# Patient Record
Sex: Female | Born: 1965 | Hispanic: Yes | Smoking: Never smoker
Health system: Southern US, Community
[De-identification: ages and names within clinical notes are randomized; demographics above are authoritative.]

## PROBLEM LIST (undated history)

## (undated) DIAGNOSIS — T7840XA Allergy, unspecified, initial encounter: Secondary | ICD-10-CM

## (undated) HISTORY — PX: TUBAL LIGATION: SHX77

---

## 2020-01-30 HISTORY — PX: JOINT REPLACEMENT: SHX530

## 2020-06-17 ENCOUNTER — Other Ambulatory Visit: Payer: Self-pay | Admitting: Orthopedic Surgery

## 2020-06-17 ENCOUNTER — Encounter: Payer: Self-pay | Admitting: Orthopedic Surgery

## 2020-06-17 NOTE — H&P (Signed)
Linda Wilkinson MRN:  [id number] DOB/SEX:  [birth year]/female  CHIEF COMPLAINT:  Stiff left Knee  HISTORY: Patient is a 54 y.o. female presented with a history of decreased range of motion in the left knee. Onset of symptoms was gradual starting several months ago with gradually worsening course since that time. Prior procedures on the knee include arthroplasty. Patient has been treated conservatively with over-the-counter NSAIDs and activity modification. Patient currently rates pain in the knee at 6 out of 10 with activity. There is pain at night.  PAST MEDICAL HISTORY: There are no problems to display for this patient.  No past medical history on file.    MEDICATIONS:  (Not in a hospital admission)   ALLERGIES:   Allergies  Allergen Reactions  . Cat Hair Extract Other (See Comments)    Runny nose /Sneezing  . Shrimp Extract Allergy Skin Test Other (See Comments)    Sneezing/Can eat max of 10      REVIEW OF SYSTEMS:  Pertinent items noted in HPI and remainder of comprehensive ROS otherwise negative.   FAMILY HISTORY:  No family history on file.  SOCIAL HISTORY:   Social History   Tobacco Use  . Smoking status: Not on file  Substance Use Topics  . Alcohol use: Not on file     EXAMINATION:  Vital signs in last 24 hours: @VSRANGES @  General appearance: alert, cooperative and no distress Neck: no JVD and supple, symmetrical, trachea midline Lungs: clear to auscultation bilaterally Heart: regular rate and rhythm, S1, S2 normal, no murmur, click, rub or gallop Abdomen: soft, non-tender; bowel sounds normal; no masses,  no organomegaly Extremities: extremities normal, atraumatic, no cyanosis or edema and Homans sign is negative, no sign of DVT Pulses: 2+ and symmetric Skin: Skin color, texture, turgor normal. No rashes or lesions Neurologic: Alert and oriented X 3, normal strength and tone. Normal symmetric reflexes. Normal coordination and gait  Musculoskeletal:  ROM  0-80, Ligaments intact,   Assessment/Plan: Stiff left knee following TKA  The patient history, physical examination and imaging studies are consistent with stiffness after TKA of  the left knee. The patient has failed conservative treatment.  The clearance notes were reviewed.  After discussion with the patient it was felt that closed knee manipulation was indicated. The procedure,  risks, and benefits of total knee arthroplasty were presented and reviewed.The patient acknowledged the explanation, agreed to proceed with the plan.  06/17/2020, 10:34 AM

## 2020-06-18 ENCOUNTER — Other Ambulatory Visit: Payer: Self-pay

## 2020-06-18 ENCOUNTER — Other Ambulatory Visit
Admission: RE | Admit: 2020-06-18 | Discharge: 2020-06-18 | Disposition: A | Discharge status: Home / Self Care | Payer: BC Managed Care – PPO | Source: Ambulatory Visit | Attending: Orthopedic Surgery | Admitting: Orthopedic Surgery

## 2020-06-18 DIAGNOSIS — Z01812 Encounter for preprocedural laboratory examination: Secondary | ICD-10-CM | POA: Insufficient documentation

## 2020-06-18 DIAGNOSIS — Z20822 Contact with and (suspected) exposure to covid-19: Secondary | ICD-10-CM | POA: Diagnosis not present

## 2020-06-18 HISTORY — DX: Allergy, unspecified, initial encounter: T78.40XA

## 2020-06-18 NOTE — Patient Instructions (Signed)
Your procedure is scheduled on: Monday June 22, 2020. Report to Day Surgery inside [facility] 2nd floor. To find out your arrival time please call [phone] between 1PM - 3PM on Friday June 19, 2020.  Remember: Instructions that are not followed completely may result in serious medical risk,  up to and including death, or upon the discretion of your surgeon and anesthesiologist your  surgery may need to be rescheduled.     _X__ 1. Do not eat food after midnight the night before your procedure.                 No chewing gum or hard candies. You may drink clear liquids up to 2 hours                 before you are scheduled to arrive for your surgery- DO not drink clear                 liquids within 2 hours of the start of your surgery.                 Clear Liquids include:  water, apple juice without pulp, clear Gatorade, G2 or                  Gatorade Zero (avoid Red/Purple/Blue), Black Coffee or Tea (Do not add                 anything to coffee or tea).  __X__2.  On the morning of surgery brush your teeth with toothpaste and water, you                may rinse your mouth with mouthwash if you wish.  Do not swallow any toothpaste of mouthwash.     _X__ 3.  No Alcohol for 24 hours before or after surgery.   _X__ 4.  Do Not Smoke or use e-cigarettes For 24 Hours Prior to Your Surgery.                 Do not use any chewable tobacco products for at least 6 hours prior to                 Surgery.  _X__  5.  Do not use any recreational drugs (marijuana, cocaine, heroin, ecstasy, MDMA or other)                For at least one week prior to your surgery.  Combination of these drugs with anesthesia                May have life threatening results.  __X__6.  Notify your doctor if there is any change in your medical condition      (cold, fever, infections).     Do not wear jewelry, make-up, hairpins, clips or nail polish. Do not wear lotions, powders,  or perfumes. You may wear deodorant. Do not shave 48 hours prior to surgery. Men may shave face and neck. Do not bring valuables to the hospital.    [facility] is not responsible for any belongings or valuables.  Contacts, dentures or bridgework may not be worn into surgery. Leave your suitcase in the car. After surgery it may be brought to your room. For patients admitted to the hospital, discharge time is determined by your treatment team.   Patients discharged the day of surgery will not be allowed to drive home.   Make arrangements for someone to be with  you for the first 24 hours of your Same Day Discharge.   __X__ Take these medicines the morning of surgery with A SIP OF WATER:    1. acetaminophen (TYLENOL) 650 MG    ____ Fleet Enema (as directed)   __X__ Use CHG Soap (or wipes) as directed  ____ Use Benzoyl Peroxide Gel as instructed  ____ Use inhalers on the day of surgery  ____ Stop metformin 2 days prior to surgery    ____ Take 1/2 of usual insulin dose the night before surgery. No insulin the morning          of surgery.   ____ Stop Coumadin/Plavix/aspirin   __X__ Stop Anti-inflammatories such as diclofenac (VOLTAREN), Ibuprofen, Aleve, Advil, naproxen, aspirin and or BC powders.    ____ Stop supplements until after surgery.    __X__ Do not start any herbal supplements before your procedure.     If you have any questions regarding your pre-procedure instructions,  Please call Pre-admit Testing at [phone].

## 2020-06-19 ENCOUNTER — Other Ambulatory Visit: Payer: BC Managed Care – PPO

## 2020-06-19 LAB — SARS CORONAVIRUS 2 (TAT 6-24 HRS): SARS Coronavirus 2: NEGATIVE

## 2020-06-21 MED ORDER — FAMOTIDINE 20 MG PO TABS: 20.0000 mg | ORAL_TABLET | Freq: Once | ORAL | Status: DC

## 2020-06-21 MED ORDER — LACTATED RINGERS IV SOLN: INTRAVENOUS | Status: DC

## 2020-06-21 MED ORDER — CHLORHEXIDINE GLUCONATE 0.12 % MT SOLN: 15.0000 mL | Freq: Once | OROMUCOSAL | Status: DC

## 2020-06-21 MED ORDER — ORAL CARE MOUTH RINSE: 15.0000 mL | Freq: Once | OROMUCOSAL | Status: DC

## 2020-06-22 ENCOUNTER — Ambulatory Visit: Payer: BC Managed Care – PPO | Admitting: Anesthesiology

## 2020-06-22 ENCOUNTER — Ambulatory Visit
Admission: RE | Admit: 2020-06-22 | Discharge: 2020-06-22 | Disposition: A | Discharge status: Home / Self Care | Payer: BC Managed Care – PPO | Attending: Orthopedic Surgery | Admitting: Orthopedic Surgery

## 2020-06-22 ENCOUNTER — Encounter
Admission: RE | Disposition: A | Discharge status: Home / Self Care | Payer: Self-pay | Source: Home / Self Care | Attending: Orthopedic Surgery

## 2020-06-22 ENCOUNTER — Other Ambulatory Visit: Payer: Self-pay

## 2020-06-22 DIAGNOSIS — M25662 Stiffness of left knee, not elsewhere classified: Secondary | ICD-10-CM | POA: Insufficient documentation

## 2020-06-22 HISTORY — PX: KNEE CLOSED REDUCTION: SHX995

## 2020-06-22 SURGERY — MANIPULATION, KNEE, CLOSED
Anesthesia: General | Site: Knee | Laterality: Left

## 2020-06-22 MED ORDER — OXYCODONE HCL 5 MG PO TABS: 5.0000 mg | ORAL_TABLET | Freq: Once | ORAL | Status: DC | PRN

## 2020-06-22 MED ORDER — ACETAMINOPHEN 325 MG PO TABS: 325.0000 mg | ORAL_TABLET | Freq: Four times a day (QID) | ORAL | Status: DC | PRN

## 2020-06-22 MED ORDER — FENTANYL CITRATE (PF) 100 MCG/2ML IJ SOLN
INTRAMUSCULAR | Status: DC | PRN
  Administered 2020-06-22 (×2): 12.5 ug via INTRAVENOUS

## 2020-06-22 MED ORDER — ONDANSETRON HCL 4 MG/2ML IJ SOLN: 4.0000 mg | Freq: Once | INTRAMUSCULAR | Status: DC | PRN

## 2020-06-22 MED ORDER — FENTANYL CITRATE (PF) 100 MCG/2ML IJ SOLN
INTRAMUSCULAR | Status: AC
  Administered 2020-06-22: 25 ug via INTRAVENOUS
  Filled 2020-06-22: qty 2

## 2020-06-22 MED ORDER — FENTANYL CITRATE (PF) 100 MCG/2ML IJ SOLN
INTRAMUSCULAR | Status: AC
  Filled 2020-06-22: qty 2

## 2020-06-22 MED ORDER — ONDANSETRON HCL 4 MG PO TABS: 4.0000 mg | ORAL_TABLET | Freq: Four times a day (QID) | ORAL | Status: DC | PRN

## 2020-06-22 MED ORDER — METOCLOPRAMIDE HCL 5 MG/ML IJ SOLN: 5.0000 mg | Freq: Three times a day (TID) | INTRAMUSCULAR | Status: DC | PRN

## 2020-06-22 MED ORDER — PROPOFOL 10 MG/ML IV BOLUS
INTRAVENOUS | Status: DC | PRN
  Administered 2020-06-22: 30 mg via INTRAVENOUS
  Administered 2020-06-22: 40 mg via INTRAVENOUS
  Administered 2020-06-22: 30 mg via INTRAVENOUS

## 2020-06-22 MED ORDER — MORPHINE SULFATE (PF) 2 MG/ML IV SOLN: 0.5000 mg | INTRAVENOUS | Status: DC | PRN

## 2020-06-22 MED ORDER — KETOROLAC TROMETHAMINE 15 MG/ML IJ SOLN
INTRAMUSCULAR | Status: AC
  Administered 2020-06-22: 15 mg via INTRAVENOUS
  Filled 2020-06-22: qty 1

## 2020-06-22 MED ORDER — FENTANYL CITRATE (PF) 100 MCG/2ML IJ SOLN
25.0000 ug | INTRAMUSCULAR | Status: DC | PRN
  Administered 2020-06-22 (×3): 25 ug via INTRAVENOUS

## 2020-06-22 MED ORDER — OXYCODONE HCL 5 MG/5ML PO SOLN: 5.0000 mg | Freq: Once | ORAL | Status: DC | PRN

## 2020-06-22 MED ORDER — METOCLOPRAMIDE HCL 10 MG PO TABS: 5.0000 mg | ORAL_TABLET | Freq: Three times a day (TID) | ORAL | Status: DC | PRN

## 2020-06-22 MED ORDER — ACETAMINOPHEN 500 MG PO TABS: 500.0000 mg | ORAL_TABLET | Freq: Four times a day (QID) | ORAL | Status: DC

## 2020-06-22 MED ORDER — LACTATED RINGERS IV SOLN: INTRAVENOUS | Status: DC

## 2020-06-22 MED ORDER — HYDROCODONE-ACETAMINOPHEN 5-325 MG PO TABS: 1.0000 | ORAL_TABLET | ORAL | Status: DC | PRN

## 2020-06-22 MED ORDER — CHLORHEXIDINE GLUCONATE 0.12 % MT SOLN
OROMUCOSAL | Status: AC
  Administered 2020-06-22: 15 mL
  Filled 2020-06-22: qty 15

## 2020-06-22 MED ORDER — ONDANSETRON HCL 4 MG/2ML IJ SOLN: 4.0000 mg | Freq: Four times a day (QID) | INTRAMUSCULAR | Status: DC | PRN

## 2020-06-22 MED ORDER — KETOROLAC TROMETHAMINE 15 MG/ML IJ SOLN: 15.0000 mg | Freq: Four times a day (QID) | INTRAMUSCULAR | Status: DC

## 2020-06-22 MED ORDER — HYDROCODONE-ACETAMINOPHEN 5-325 MG PO TABS: 1.0000 | ORAL_TABLET | ORAL | 0 refills | Status: AC | PRN

## 2020-06-22 MED ORDER — FAMOTIDINE 20 MG PO TABS
ORAL_TABLET | ORAL | Status: AC
  Administered 2020-06-22: 20 mg
  Filled 2020-06-22: qty 1

## 2020-06-22 MED ORDER — HYDROCODONE-ACETAMINOPHEN 7.5-325 MG PO TABS: 1.0000 | ORAL_TABLET | ORAL | Status: DC | PRN

## 2020-06-22 SURGICAL SUPPLY — 5 items
BNDG ELASTIC 6X5.8 VLCR STR LF (GAUZE/BANDAGES/DRESSINGS) ×3 IMPLANT
COOLER POLAR GLACIER W/PUMP (MISCELLANEOUS) ×3 IMPLANT
KIT TURNOVER KIT A (KITS) ×3 IMPLANT
PAD WRAPON POLAR KNEE (MISCELLANEOUS) ×1 IMPLANT
WRAPON POLAR PAD KNEE (MISCELLANEOUS) ×3

## 2020-06-22 NOTE — H&P (Signed)
The patient has been re-examined, and the chart reviewed, and there have been no interval changes to the documented history and physical.  Plan a left knee manipulation today.  Anesthesia is consulted regarding a peripheral nerve block for post-operative pain.  The risks, benefits, and alternatives have been discussed at length, and the patient is willing to proceed.     

## 2020-06-22 NOTE — Anesthesia Preprocedure Evaluation (Addendum)
Anesthesia Evaluation  Patient identified by MRN, date of birth, ID band Patient awake    Reviewed: Allergy & Precautions, H&P , NPO status , Patient's Chart, lab work & pertinent test results  History of Anesthesia Complications Negative for: history of anesthetic complications  Airway Mallampati: II  TM Distance: >3 FB Neck ROM: full    Dental  (+) Teeth Intact   Pulmonary neg pulmonary ROS, neg sleep apnea, neg COPD,    breath sounds clear to auscultation       Cardiovascular (-) angina(-) Past MI and (-) Cardiac Stents negative cardio ROS  (-) dysrhythmias  Rhythm:regular Rate:Normal     Neuro/Psych negative neurological ROS  negative psych ROS   GI/Hepatic negative GI ROS, Neg liver ROS,   Endo/Other  negative endocrine ROS  Renal/GU negative Renal ROS  negative genitourinary   Musculoskeletal   Abdominal   Peds  Hematology negative hematology ROS (+)   Anesthesia Other Findings Past Medical History: No date: Allergies  Past Surgical History: 01/2020: JOINT REPLACEMENT No date: TUBAL LIGATION     Reproductive/Obstetrics negative OB ROS                            Anesthesia Physical Anesthesia Plan  ASA: I  Anesthesia Plan: General   Post-op Pain Management:    Induction:   PONV Risk Score and Plan:   Airway Management Planned: Natural Airway  Additional Equipment:   Intra-op Plan:   Post-operative Plan:   Informed Consent: I have reviewed the patients History and Physical, chart, labs and discussed the procedure including the risks, benefits and alternatives for the proposed anesthesia with the patient or authorized representative who has indicated his/her understanding and acceptance.     Dental Advisory Given  Plan Discussed with: Anesthesiologist, CRNA and Surgeon  Anesthesia Plan Comments:         Anesthesia Quick Evaluation

## 2020-06-22 NOTE — Transfer of Care (Signed)
Immediate Anesthesia Transfer of Care Note  Patient: Linda Wilkinson  Procedure(s) Performed: CLOSED MANIPULATION KNEE (Left Knee)  Patient Location: PACU  Anesthesia Type:General  Level of Consciousness: awake  Airway & Oxygen Therapy: Patient Spontanous Breathing and Patient connected to face mask oxygen  Post-op Assessment: Report given to RN and Post -op Vital signs reviewed and stable  Post vital signs: Reviewed and stable  Last Vitals:  Vitals Value Taken Time  BP 109/57 06/22/20 1403  Temp    Pulse 56 06/22/20 1403  Resp 10 06/22/20 1403  SpO2 100 % 06/22/20 1403  Vitals shown include unvalidated device data.  Last Pain:  Vitals:   06/22/20 1303  TempSrc: Oral  PainSc: 3          Complications: No complications documented.

## 2020-06-22 NOTE — Discharge Instructions (Signed)
AMBULATORY SURGERY  DISCHARGE INSTRUCTIONS   1) The drugs that you were given will stay in your system until tomorrow so for the next 24 hours you should not:  A) Drive an automobile B) Make any legal decisions C) Drink any alcoholic beverage   2) You may resume regular meals tomorrow.  Today it is better to start with liquids and gradually work up to solid foods.  You may eat anything you prefer, but it is better to start with liquids, then soup and crackers, and gradually work up to solid foods.   3) Please notify your doctor immediately if you have any unusual bleeding, trouble breathing, redness and pain at the surgery site, drainage, fever, or pain not relieved by medication. 4)   5) Your post-operative visit with Dr.                                     is: Date:                        Time:    Please call to schedule your post-operative visit.  6) Additional Instructions:    Continue weight bear as tolerated on the left lower extremity.    Elevate the left lower extremity whenever possible and continue the polar care while elevating the extremity.   Continue to work on knee range of motion exercises at home as instructed by physical therapy.  Call [phone] with any questions.

## 2020-06-22 NOTE — Anesthesia Postprocedure Evaluation (Signed)
Anesthesia Post Note  Patient: Linda Wilkinson  Procedure(s) Performed: CLOSED MANIPULATION KNEE (Left Knee)  Patient location during evaluation: PACU Anesthesia Type: General Level of consciousness: awake and alert Pain management: pain level controlled Vital Signs Assessment: post-procedure vital signs reviewed and stable Respiratory status: spontaneous breathing, nonlabored ventilation and respiratory function stable Cardiovascular status: blood pressure returned to baseline and stable Postop Assessment: no apparent nausea or vomiting Anesthetic complications: no   No complications documented.   Last Vitals:  Vitals:   06/22/20 1450 06/22/20 1503  BP: 120/75 120/60  Pulse: (!) 53 (!) 53  Resp: (!) 9 11  Temp:  36.7 C  SpO2: 100% 100%    Last Pain:  Vitals:   06/22/20 1503  TempSrc:   PainSc: 0-No pain                 Aurelio Brash Ferrell Flam

## 2020-06-22 NOTE — Op Note (Signed)
06/22/2020  2:06 PM  PATIENT:  Linda Wilkinson  54 y.o. female  PRE-OPERATIVE DIAGNOSIS:  Z96.652 Presence of left artificial knee joint, stiffness  POST-OPERATIVE DIAGNOSIS:  same  PROCEDURE:  Procedure(s): CLOSED MANIPULATION KNEE (Left)  SURGEON:  Surgeon(s) and Role:    Lyndle Herrlich, MD - Primary  PHYSICIAN ASSISTANT: none  ANESTHESIA:   general  EBL: 0  Total I/O In: 300 [I.V.:300] Out: -   Description of Procedure: The left knee ROM was 2 to 90 degrees, ligaments were stable, there was excellent patellar tracking. After informed consent and appropriate anesthesia, the knee was manipulated to full extension and 125 degrees of flexion with minimal difficulty. She tolerated the procedure well. The ligaments were stable and patella tracked appropriately.  She was taken to the recovery room in good condition.

## 2020-06-23 ENCOUNTER — Encounter: Payer: Self-pay | Admitting: Orthopedic Surgery

## 2021-02-10 ENCOUNTER — Other Ambulatory Visit: Payer: Self-pay | Admitting: Orthopedic Surgery

## 2021-02-10 DIAGNOSIS — Z96652 Presence of left artificial knee joint: Secondary | ICD-10-CM

## 2021-02-10 DIAGNOSIS — T8484XA Pain due to internal orthopedic prosthetic devices, implants and grafts, initial encounter: Secondary | ICD-10-CM

## 2021-02-22 ENCOUNTER — Other Ambulatory Visit: Payer: Self-pay

## 2021-02-22 ENCOUNTER — Encounter
Admission: RE | Admit: 2021-02-22 | Discharge: 2021-02-22 | Disposition: A | Discharge status: Home / Self Care | Payer: BC Managed Care – PPO | Source: Ambulatory Visit | Attending: Orthopedic Surgery | Admitting: Orthopedic Surgery

## 2021-02-22 DIAGNOSIS — T8484XA Pain due to internal orthopedic prosthetic devices, implants and grafts, initial encounter: Secondary | ICD-10-CM | POA: Insufficient documentation

## 2021-02-22 DIAGNOSIS — Z96652 Presence of left artificial knee joint: Secondary | ICD-10-CM | POA: Insufficient documentation

## 2021-02-22 MED ORDER — TECHNETIUM TC 99M MEDRONATE IV KIT
20.0000 | PACK | Freq: Once | INTRAVENOUS | Status: AC | PRN
  Administered 2021-02-22: 22.83 via INTRAVENOUS

## 2023-07-22 IMAGING — NM NM BONE 3 PHASE
2 series · 8 of 8 positions shown · non-contrast
Comparison: None.

CLINICAL DATA: Presence of left artificial knee joint. Status post
left knee arthroplasty on 02/28/2020. Patient complains of pain.

EXAM:
NUCLEAR MEDICINE 3-PHASE BONE SCAN
TECHNIQUE: Radionuclide angiographic images, immediate static blood pool
images, and 3-hour delayed static images were obtained of the knees
after intravenous injection of radiopharmaceutical.
RADIOPHARMACEUTICALS:  22.83 mCi Yc-GGm MDP IV

[Series 1000: immediate · 4.80mm/px · 2 of 2 frames shown]
[frame 1/2]
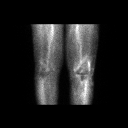
[frame 2/2]
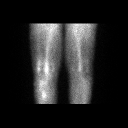

[Series 1000: flow · 4.80mm/px · 6 of 60 frames shown]
[frame 6/60  full-range]
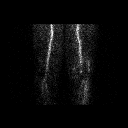
[frame 16/60  full-range]
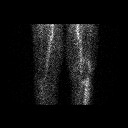
[frame 26/60  full-range]
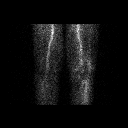
[frame 36/60  full-range]
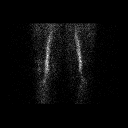
[frame 46/60  full-range]
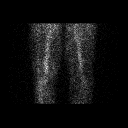
[frame 56/60  full-range]
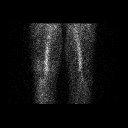

[8 of 8 positions shown; findings below may reference images not displayed]

FINDINGS: Vascular phase: There is mild asymmetric increased blood flow to the
left knee.

Blood pool phase: Asymmetric increased soft tissue uptake surrounds
the femoral and tibial components of the left knee arthroplasty
device.

Delayed phase: Increased radiotracer uptake localizes to the bone
overlying the femoral and proximal tibial components of the left
knee arthroplasty device.
IMPRESSION: 1. Asymmetric increased uptake localizes to the left knee on all 3
phases which in the appropriate clinical setting may reflect
arthroplasty device loosening or infection.
